# Patient Record
Sex: Male | Born: 1955 | Race: White | Hispanic: No | Marital: Married | Smoking: Never smoker
Health system: Southern US, Community
[De-identification: ages and names within clinical notes are randomized; demographics above are authoritative.]

## PROBLEM LIST (undated history)

## (undated) DIAGNOSIS — K900 Celiac disease: Secondary | ICD-10-CM

## (undated) DIAGNOSIS — K635 Polyp of colon: Secondary | ICD-10-CM

## (undated) DIAGNOSIS — H9193 Unspecified hearing loss, bilateral: Secondary | ICD-10-CM

## (undated) HISTORY — PX: EXTRAPLEURAL PNEUMONECTOMY AND RECONSTRUCTION DIAPHRAM: SUR597

## (undated) HISTORY — DX: Polyp of colon: K63.5

## (undated) HISTORY — DX: Celiac disease: K90.0

## (undated) HISTORY — PX: OTHER SURGICAL HISTORY: SHX169

## (undated) HISTORY — DX: Unspecified hearing loss, bilateral: H91.93

---

## 2008-07-28 ENCOUNTER — Encounter: Payer: Self-pay | Admitting: Family Medicine

## 2008-12-02 ENCOUNTER — Ambulatory Visit: Payer: Self-pay | Admitting: Family Medicine

## 2008-12-02 DIAGNOSIS — Z860100 Personal history of colon polyps, unspecified: Secondary | ICD-10-CM | POA: Insufficient documentation

## 2008-12-02 DIAGNOSIS — S270XXA Traumatic pneumothorax, initial encounter: Secondary | ICD-10-CM | POA: Insufficient documentation

## 2009-01-06 ENCOUNTER — Ambulatory Visit: Payer: Self-pay | Admitting: Family Medicine

## 2009-01-13 ENCOUNTER — Ambulatory Visit: Payer: Self-pay | Admitting: Family Medicine

## 2009-09-27 ENCOUNTER — Ambulatory Visit: Payer: Self-pay | Admitting: Family Medicine

## 2009-09-27 DIAGNOSIS — J069 Acute upper respiratory infection, unspecified: Secondary | ICD-10-CM | POA: Insufficient documentation

## 2010-01-19 ENCOUNTER — Ambulatory Visit: Payer: Self-pay | Admitting: Family Medicine

## 2010-03-07 IMAGING — CR DG CHEST 2V
2 series · 2 of 2 positions shown · non-contrast
Comparison: None

CLINICAL DATA: Follow-up pneumothorax.

CHEST - 2 VIEW

[view not recorded (1 of 2)]
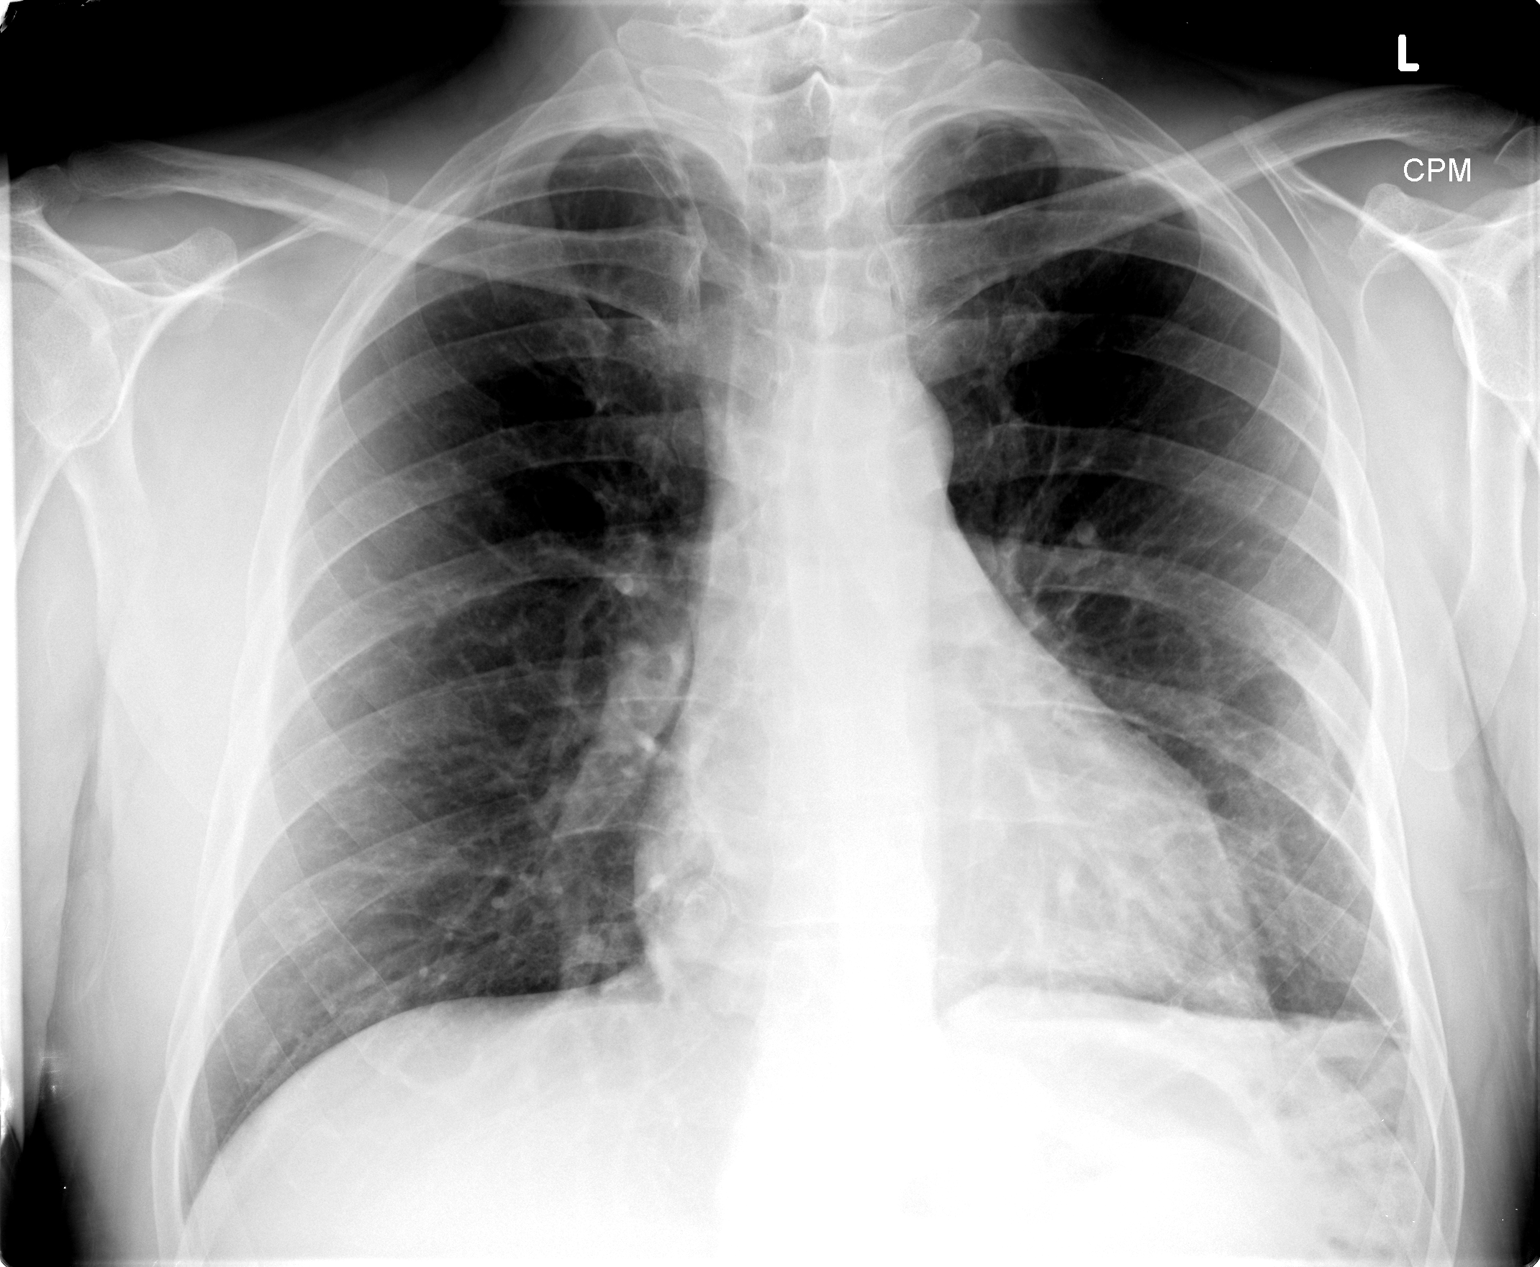

[view not recorded (2 of 2)]
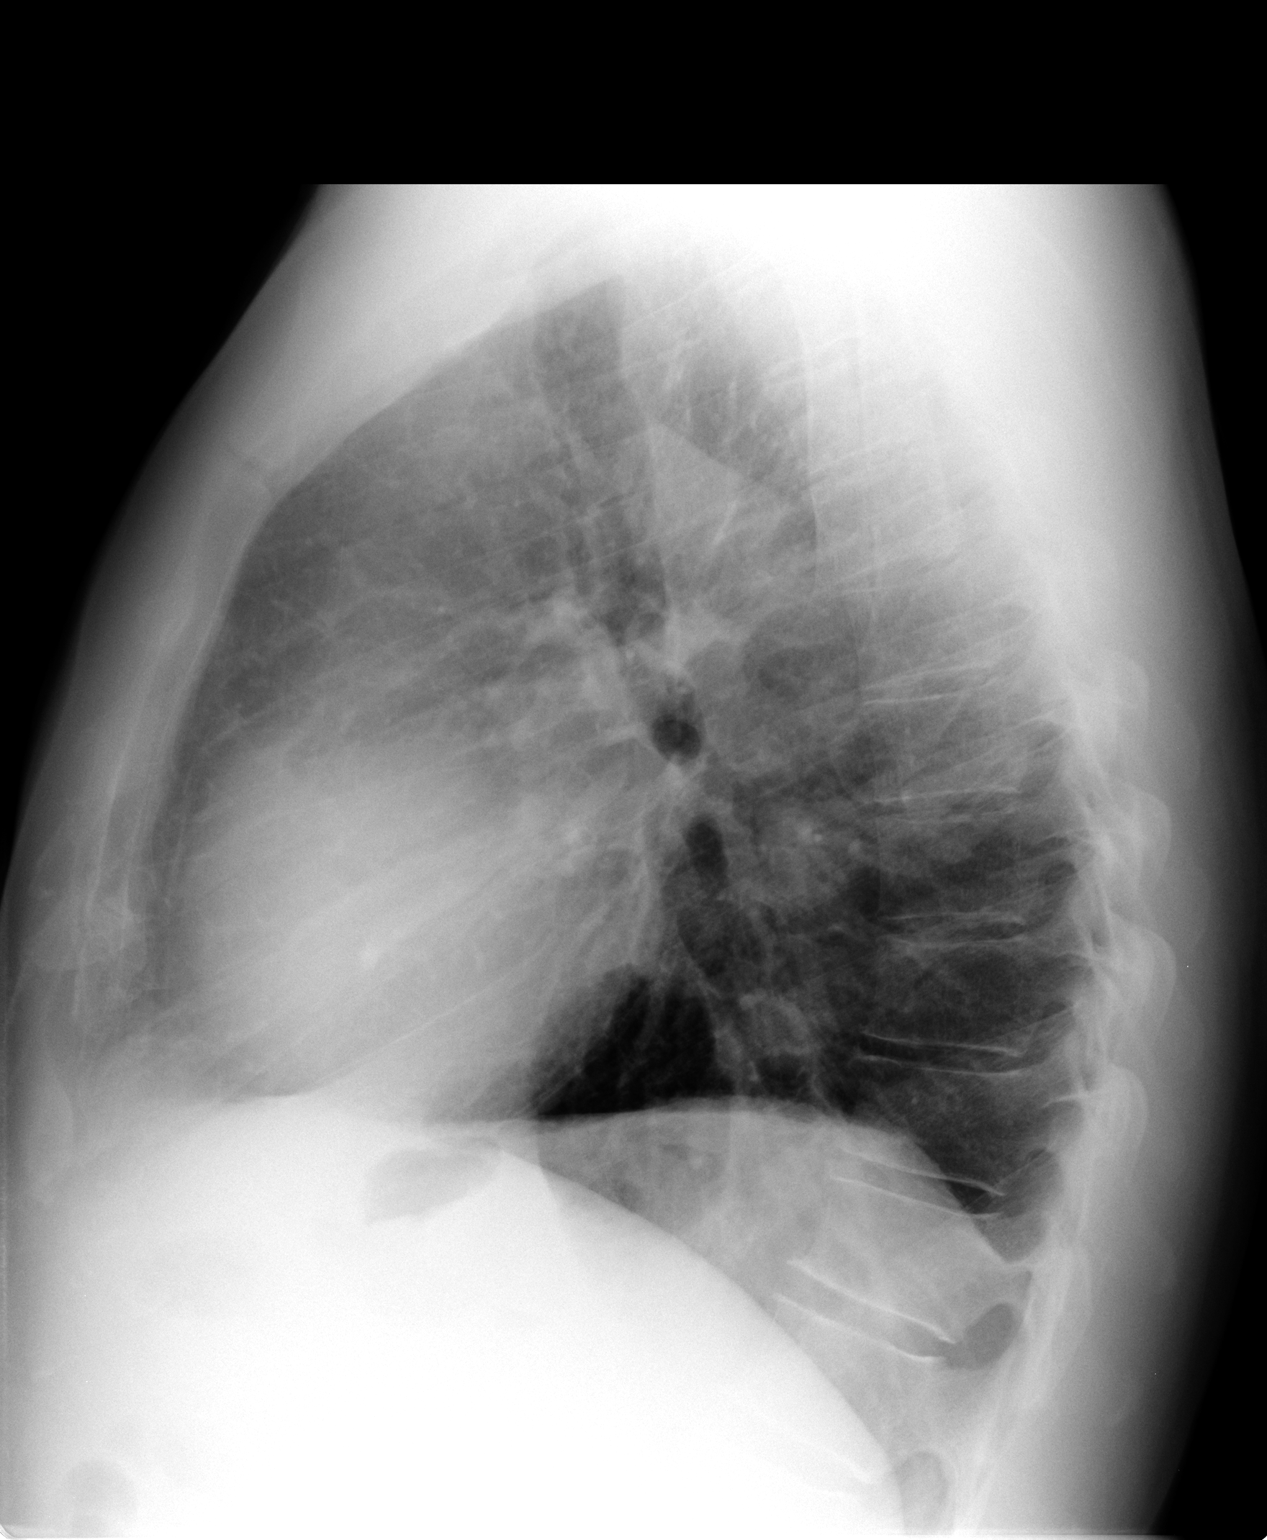

[2 of 2 positions shown; findings below may reference images not displayed]

FINDINGS: Heart size is normal.  The mediastinum is unremarkable.
The right lung is clear.  On the left, I do not see a pneumothorax.
There is mild scarring or atelectasis at the left base.  No pleural
effusion.  No significant bony finding.
IMPRESSION: Mild scarring or atelectasis left lower lobe.  No pneumothorax
seen.

## 2010-10-10 ENCOUNTER — Ambulatory Visit: Payer: Self-pay | Admitting: Family Medicine

## 2010-10-10 LAB — CONVERTED CEMR LAB
ALT: 24 units/L (ref 0–53)
AST: 20 units/L (ref 0–37)
Albumin: 4.2 g/dL (ref 3.5–5.2)
BUN: 18 mg/dL (ref 6–23)
Basophils Relative: 0.7 % (ref 0.0–3.0)
Bilirubin Urine: NEGATIVE
Chloride: 105 meq/L (ref 96–112)
Cholesterol: 175 mg/dL (ref 0–200)
Eosinophils Relative: 4.1 % (ref 0.0–5.0)
GFR calc non Af Amer: 87.64 mL/min (ref >60)
HCT: 42.9 % (ref 39.0–52.0)
Hemoglobin: 14.8 g/dL (ref 13.0–17.0)
Ketones, ur: NEGATIVE mg/dL
LDL Cholesterol: 117 mg/dL — ABNORMAL HIGH (ref 0–99)
Leukocytes, UA: NEGATIVE
Lymphs Abs: 2.8 10*3/uL (ref 0.7–4.0)
MCV: 95.5 fL (ref 78.0–100.0)
Monocytes Absolute: 0.7 10*3/uL (ref 0.1–1.0)
Monocytes Relative: 9.9 % (ref 3.0–12.0)
Neutro Abs: 3.2 10*3/uL (ref 1.4–7.7)
PSA: 0.73 ng/mL (ref 0.10–4.00)
Platelets: 275 10*3/uL (ref 150.0–400.0)
Potassium: 5 meq/L (ref 3.5–5.1)
Sodium: 139 meq/L (ref 135–145)
Specific Gravity, Urine: 1.02 (ref 1.000–1.030)
TSH: 0.52 microintl units/mL (ref 0.35–5.50)
Total Protein: 7.1 g/dL (ref 6.0–8.3)
Urine Glucose: NEGATIVE mg/dL
Urobilinogen, UA: 0.2 (ref 0.0–1.0)
WBC: 7 10*3/uL (ref 4.5–10.5)

## 2010-11-05 ENCOUNTER — Encounter: Payer: Self-pay | Admitting: Family Medicine

## 2010-11-05 ENCOUNTER — Ambulatory Visit: Payer: Self-pay | Admitting: Family Medicine

## 2010-11-13 ENCOUNTER — Ambulatory Visit: Payer: Self-pay | Admitting: Family Medicine

## 2010-11-13 DIAGNOSIS — H902 Conductive hearing loss, unspecified: Secondary | ICD-10-CM | POA: Insufficient documentation
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
## 2010-12-27 NOTE — Assessment & Plan Note (Signed)
Summary: ears clooged/njr  CC: ears clogged   CC:  ears clogged.  History of Present Illness: Dwayne Lyons is a 55 year old male, who comes in today to remove bilateral cerumen impactions.  After 45 minutes of flushing irrigation and suction.  The ear wax was removed from his left ear however, the ear wax was not able to be removed in the right ear canal.  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 1 or 2 tsps three times a day as needed 2)  Amoxicillin 875 Mg Tabs (Amoxicillin) .... One by mouth two times a day for 10 days.  Patient Instructions: 1)  used the earwax dissolving drops in ear, right ear canal.  Return in 3 weeks for reevaluation   Orders Added: 1)  Est. Patient Level III [16109]

## 2011-03-18 ENCOUNTER — Ambulatory Visit (INDEPENDENT_AMBULATORY_CARE_PROVIDER_SITE_OTHER): Payer: BC Managed Care – PPO | Admitting: Family Medicine

## 2011-03-18 ENCOUNTER — Encounter: Payer: Self-pay | Admitting: Family Medicine

## 2011-03-18 VITALS — BP 110/80 | Temp 98.1°F | Ht 74.0 in | Wt 242.0 lb

## 2011-03-18 DIAGNOSIS — H9190 Unspecified hearing loss, unspecified ear: Secondary | ICD-10-CM

## 2011-03-18 DIAGNOSIS — J45909 Unspecified asthma, uncomplicated: Secondary | ICD-10-CM

## 2011-03-18 DIAGNOSIS — H9191 Unspecified hearing loss, right ear: Secondary | ICD-10-CM

## 2011-03-18 MED ORDER — PREDNISONE 20 MG PO TABS: ORAL_TABLET | ORAL | Status: DC

## 2011-03-18 NOTE — Patient Instructions (Signed)
Take the prednisone as directed.  Return p.r.n. 

## 2011-03-18 NOTE — Progress Notes (Signed)
  Subjective:    Patient ID: Dwayne Lyons, male    DOB: 07/07/1956, 55 y.o.   MRN: [id number]  Dwayne Lyons is a 55 year old, married man nonsmoker, who comes in with a 4-day history of head congestion, postnasal drip, and cough.  No fever.  He has had a history of allergic rhinitis in the past.  Last week.  He was at [employer], and there was a lot of dust.  Also, he can hear out of his right ear.  In the past has had problems with ear wax    Review of Systems General infectious and immunologic review of systems otherwise negative    Objective:   Physical Exam Well developed, well nourished man in no acute distress.  HEENT were negative, except for cerumen impaction right ear, which was removed by irrigation.  Neck was supple.  No adenopathy.  Lungs are clear except for some mild late expiratory wheezing       Assessment & Plan:  Asthma,,,,,,,,,,,, prednisone burst and taper.  Hearing loss right ear secondary to cerumen impaction removed by irrigation

## 2011-03-20 ENCOUNTER — Telehealth: Payer: Self-pay | Admitting: Family Medicine

## 2011-03-20 MED ORDER — HYDROCODONE-HOMATROPINE 5-1.5 MG/5ML PO SYRP: 4.0000 mL | ORAL_SOLUTION | Freq: Four times a day (QID) | ORAL | Status: DC | PRN

## 2011-03-20 NOTE — Telephone Encounter (Signed)
Pt called and said that he is still coughing, especially at night. Pt is req that Dr Tawanna Cooler call in a cough syrup. Cough keeping pt up at night. [facility] [address]

## 2011-03-20 NOTE — Telephone Encounter (Signed)
rx called in

## 2011-03-20 NOTE — Telephone Encounter (Signed)
Hydromet 4 ounces directions one half to 1 teaspoon at bedtime p.r.n. Cough refill x 1

## 2011-03-21 ENCOUNTER — Telehealth: Payer: Self-pay | Admitting: Family Medicine

## 2011-03-21 NOTE — Telephone Encounter (Signed)
We do not call in antibiotics over the phone if he feels like he needs to be seen will be happy to see him this afternoon

## 2011-03-21 NOTE — Telephone Encounter (Signed)
Tried to call but unable to leave message for patient

## 2011-03-21 NOTE — Telephone Encounter (Signed)
Pt called and said that the cough syrup that was prescribed, still kept pt up all night. Pt says that the congestion is now yellowish green in color. Pt says throat is sore and still has cough. Pt wondering if he can get antibiotic [facility] [address].

## 2011-09-10 ENCOUNTER — Encounter: Payer: Self-pay | Admitting: Family Medicine

## 2011-09-10 ENCOUNTER — Ambulatory Visit (INDEPENDENT_AMBULATORY_CARE_PROVIDER_SITE_OTHER): Payer: BC Managed Care – PPO | Admitting: Family Medicine

## 2011-09-10 VITALS — Temp 98.6°F | Wt 246.0 lb

## 2011-09-10 DIAGNOSIS — Z23 Encounter for immunization: Secondary | ICD-10-CM

## 2011-09-10 DIAGNOSIS — J069 Acute upper respiratory infection, unspecified: Secondary | ICD-10-CM

## 2011-09-10 MED ORDER — HYDROCODONE-HOMATROPINE 5-1.5 MG/5ML PO SYRP: ORAL_SOLUTION | ORAL | Status: DC

## 2011-09-10 NOTE — Patient Instructions (Signed)
Take Tylenol, Motrin or aspirin for general cold symptoms.  Drink lots of water.  Hydromet..... One half to 1 teaspoon at bedtime p.r.n. For cough.  Return p.r.n.

## 2011-09-10 NOTE — Progress Notes (Signed)
  Subjective:    Patient ID: Dwayne Lyons, male    DOB: December 04, 1955, 55 y.o.   MRN: [id number]  HPI Dwayne Lyons is a 55 year old, married male, nonsmoker, who comes in today with a cold for 5 days.  He has had congestion, runny nose, and cough.  No sputum production.  No fever, no shortness of breath, etc., etc.   Review of Systems    General and pulmonary use systems otherwise negative Objective:   Physical Exam  Well-developed well-nourished, male in no acute distress.  HEENT negative.  Neck was supple.  No adenopathy.  Lungs clear      Assessment & Plan:  Viral syndrome.  Plan treat symptomatically, lots of liquids, Tylenol, Hydromet p.r.n. cough

## 2011-11-27 ENCOUNTER — Telehealth: Payer: Self-pay

## 2011-11-27 NOTE — Telephone Encounter (Signed)
Pt is traveling and is experiencing extreme discomfort.  Pt is thinking it is from a hital hernia.  Pt is requesting appt on 1/10 with Dr. Tawanna Cooler.   Called and offered pt 12/05/11 at 11:00 pt declined.  Pt will call back to schedule appt.

## 2011-12-02 ENCOUNTER — Ambulatory Visit (INDEPENDENT_AMBULATORY_CARE_PROVIDER_SITE_OTHER): Payer: BC Managed Care – PPO | Admitting: Family Medicine

## 2011-12-02 ENCOUNTER — Encounter: Payer: Self-pay | Admitting: Family Medicine

## 2011-12-02 DIAGNOSIS — R1011 Right upper quadrant pain: Secondary | ICD-10-CM

## 2011-12-02 NOTE — Patient Instructions (Signed)
Return if the symptoms recur for reevaluation
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
## 2011-12-05 ENCOUNTER — Ambulatory Visit: Payer: BC Managed Care – PPO | Admitting: Family Medicine

## 2012-12-10 ENCOUNTER — Ambulatory Visit (INDEPENDENT_AMBULATORY_CARE_PROVIDER_SITE_OTHER): Payer: BC Managed Care – PPO | Admitting: Family Medicine

## 2012-12-10 ENCOUNTER — Encounter: Payer: Self-pay | Admitting: Family Medicine

## 2012-12-10 ENCOUNTER — Telehealth: Payer: Self-pay | Admitting: Family Medicine

## 2012-12-10 VITALS — BP 120/80 | Temp 97.8°F | Wt 237.0 lb

## 2012-12-10 DIAGNOSIS — J069 Acute upper respiratory infection, unspecified: Secondary | ICD-10-CM

## 2012-12-10 MED ORDER — HYDROCODONE-HOMATROPINE 5-1.5 MG/5ML PO SYRP: 5.0000 mL | ORAL_SOLUTION | Freq: Three times a day (TID) | ORAL | Status: DC | PRN
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
## 2012-12-10 NOTE — Telephone Encounter (Signed)
appt set thanks.

## 2012-12-10 NOTE — Telephone Encounter (Signed)
Pt has severe cough, hard to talk, no fever, chest congestion, some runny nose, thick phlem, if he does anything , pt gets very exhausted. Pls advise.

## 2012-12-10 NOTE — Telephone Encounter (Signed)
Please schedule at 12:45

## 2012-12-10 NOTE — Patient Instructions (Signed)
Drink lots of water  Hydromet 1/2-1 teaspoon 3 times daily. For cough and cold  Return.

## 2013-03-02 ENCOUNTER — Telehealth: Payer: Self-pay | Admitting: Family Medicine

## 2013-03-02 DIAGNOSIS — J069 Acute upper respiratory infection, unspecified: Secondary | ICD-10-CM

## 2013-03-02 MED ORDER — HYDROCODONE-HOMATROPINE 5-1.5 MG/5ML PO SYRP: 5.0000 mL | ORAL_SOLUTION | Freq: Three times a day (TID) | ORAL | Status: DC | PRN

## 2013-03-02 NOTE — Telephone Encounter (Signed)
Per Dr Tawanna Cooler - Hydromet

## 2013-03-02 NOTE — Telephone Encounter (Signed)
Patient came in today thinking he had an appointment that was supposedly made on yesterday for today at 12:30.  He was coming in for cough and congestion.  He says that he had his spline taken out years ago and at some point around this time of year this always happens to him.  He said it started off as congestion, then went to a barking cough at night, then last night, it went to his head, he has sinus pressure, cough and headache. [facility] [address]. Pt's cell is [phone]. Please advise. Thank you

## 2014-04-20 ENCOUNTER — Telehealth: Payer: Self-pay | Admitting: Family Medicine

## 2014-04-20 NOTE — Telephone Encounter (Signed)
error 

## 2014-04-25 ENCOUNTER — Telehealth: Payer: Self-pay | Admitting: Family Medicine

## 2014-04-25 ENCOUNTER — Ambulatory Visit: Payer: BC Managed Care – PPO | Admitting: Family Medicine

## 2014-04-25 NOTE — Telephone Encounter (Signed)
noted 

## 2014-04-25 NOTE — Telephone Encounter (Signed)
Pt cancelled today's appointment due to work schedule.  Will callback to r/s.

## 2014-05-31 ENCOUNTER — Encounter: Payer: Self-pay | Admitting: Family Medicine

## 2014-05-31 ENCOUNTER — Ambulatory Visit (INDEPENDENT_AMBULATORY_CARE_PROVIDER_SITE_OTHER): Payer: BC Managed Care – PPO | Admitting: Family Medicine

## 2014-05-31 VITALS — BP 118/76 | HR 70 | Temp 98.6°F | Wt 244.0 lb

## 2014-05-31 DIAGNOSIS — R05 Cough: Secondary | ICD-10-CM

## 2014-05-31 DIAGNOSIS — R059 Cough, unspecified: Secondary | ICD-10-CM

## 2014-05-31 DIAGNOSIS — J3089 Other allergic rhinitis: Secondary | ICD-10-CM

## 2014-05-31 DIAGNOSIS — J302 Other seasonal allergic rhinitis: Secondary | ICD-10-CM

## 2014-05-31 MED ORDER — PREDNISONE 20 MG PO TABS: ORAL_TABLET | ORAL | Status: DC

## 2014-05-31 NOTE — Progress Notes (Signed)
Pre visit review using our clinic review tool, if applicable. No additional management support is needed unless otherwise documented below in the visit note. 

## 2014-05-31 NOTE — Patient Instructions (Addendum)
Allergic Rhinitis Allergic rhinitis is when the mucous membranes in the nose respond to allergens. Allergens are particles in the air that cause your body to have an allergic reaction. This causes you to release allergic antibodies. Through a chain of events, these eventually cause you to release histamine into the blood stream. Although meant to protect the body, it is this release of histamine that causes your discomfort, such as frequent sneezing, congestion, and an itchy, runny nose.  CAUSES  Seasonal allergic rhinitis (hay fever) is caused by pollen allergens that may come from grasses, trees, and weeds. Year-round allergic rhinitis (perennial allergic rhinitis) is caused by allergens such as house dust mites, pet dander, and mold spores.  SYMPTOMS   Nasal stuffiness (congestion).  Itchy, runny nose with sneezing and tearing of the eyes. DIAGNOSIS  Your health care provider can help you determine the allergen or allergens that trigger your symptoms. If you and your health care provider are unable to determine the allergen, skin or blood testing may be used. TREATMENT  Allergic rhinitis does not have a cure, but it can be controlled by:  Medicines and allergy shots (immunotherapy).  Avoiding the allergen. Hay fever may often be treated with antihistamines in pill or nasal spray forms. Antihistamines block the effects of histamine. There are over-the-counter medicines that may help with nasal congestion and swelling around the eyes. Check with your health care provider before taking or giving this medicine.  If avoiding the allergen or the medicine prescribed do not work, there are many new medicines your health care provider can prescribe. Stronger medicine may be used if initial measures are ineffective. Desensitizing injections can be used if medicine and avoidance does not work. Desensitization is when a patient is given ongoing shots until the body becomes less sensitive to the allergen.  Make sure you follow up with your health care provider if problems continue. HOME CARE INSTRUCTIONS It is not possible to completely avoid allergens, but you can reduce your symptoms by taking steps to limit your exposure to them. It helps to know exactly what you are allergic to so that you can avoid your specific triggers. SEEK MEDICAL CARE IF:   You have a fever.  You develop a cough that does not stop easily (persistent).  You have shortness of breath.  You start wheezing.  Symptoms interfere with normal daily activities. Document Released: 08/06/2001 Document Revised: 11/16/2013 Document Reviewed: 07/19/2013 [facility] Patient Information 2015 [employer], [place]. This information is not intended to replace advice given to you by your health care provider. Make sure you discuss any questions you have with your health care provider.  Consider Flonase or Nasacort (in addition to Claritan or Zyrtec) for allergy relief.
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
## 2014-07-26 ENCOUNTER — Ambulatory Visit (INDEPENDENT_AMBULATORY_CARE_PROVIDER_SITE_OTHER): Payer: BC Managed Care – PPO | Admitting: Family Medicine

## 2014-07-26 ENCOUNTER — Encounter: Payer: Self-pay | Admitting: Family Medicine

## 2014-07-26 VITALS — BP 110/80 | Temp 98.6°F | Wt 240.0 lb

## 2014-07-26 DIAGNOSIS — J069 Acute upper respiratory infection, unspecified: Secondary | ICD-10-CM

## 2014-07-26 MED ORDER — HYDROCODONE-HOMATROPINE 5-1.5 MG/5ML PO SYRP: 5.0000 mL | ORAL_SOLUTION | Freq: Three times a day (TID) | ORAL | Status: DC | PRN

## 2014-07-26 NOTE — Patient Instructions (Signed)
Drink lots of water  Hydromet...........Marland Kitchen 1/2-1 teaspoon 3 times daily. For cough  Ear wax kit///////// 2 drops left ear bedtime....... flush in 2 weeks with warm water

## 2014-07-26 NOTE — Progress Notes (Signed)
   Subjective:    Patient ID: Dwayne Lyons, male    DOB: 07-05-1956, 58 y.o.   MRN: [id number]  HPI Antonin is a 58 year old married male nonsmoker who comes in with a five-day history of a cough  He developed some head congestion cough about 5 days ago. No fever chills no shortness of breath no wheezing no sputum production.   Review of Systems    pulmonary review of systems otherwise negative Objective:   Physical Exam  Well-developed well-nourished male in no acute distress vital signs stable he is afebrile HEENT negative neck except for cerumen impaction left ear  Cardiac exam normal pulmonary exam normal      Assessment & Plan:  Viral syndrome........ treat symptomatically with cough syrup

## 2014-07-26 NOTE — Progress Notes (Signed)
Pre visit review using our clinic review tool, if applicable. No additional management support is needed unless otherwise documented below in the visit note. 

## 2014-08-02 ENCOUNTER — Ambulatory Visit (INDEPENDENT_AMBULATORY_CARE_PROVIDER_SITE_OTHER): Payer: BC Managed Care – PPO | Admitting: Family Medicine

## 2014-08-02 ENCOUNTER — Encounter: Payer: Self-pay | Admitting: Family Medicine

## 2014-08-02 VITALS — BP 110/80 | Temp 98.1°F | Ht 74.0 in | Wt 235.0 lb

## 2014-08-02 DIAGNOSIS — H6123 Impacted cerumen, bilateral: Secondary | ICD-10-CM

## 2014-08-02 DIAGNOSIS — Z86010 Personal history of colonic polyps: Secondary | ICD-10-CM

## 2014-08-02 DIAGNOSIS — Z23 Encounter for immunization: Secondary | ICD-10-CM

## 2014-08-02 DIAGNOSIS — J069 Acute upper respiratory infection, unspecified: Secondary | ICD-10-CM

## 2014-08-02 DIAGNOSIS — Z800 Family history of malignant neoplasm of digestive organs: Secondary | ICD-10-CM | POA: Insufficient documentation

## 2014-08-02 DIAGNOSIS — H6120 Impacted cerumen, unspecified ear: Secondary | ICD-10-CM

## 2014-08-02 DIAGNOSIS — H902 Conductive hearing loss, unspecified: Secondary | ICD-10-CM

## 2014-08-02 DIAGNOSIS — Z0000 Encounter for general adult medical examination without abnormal findings: Secondary | ICD-10-CM

## 2014-08-02 LAB — CBC WITH DIFFERENTIAL/PLATELET
BASOS ABS: 0.1 10*3/uL (ref 0.0–0.1)
Basophils Relative: 1.3 % (ref 0.0–3.0)
EOS ABS: 0.3 10*3/uL (ref 0.0–0.7)
Eosinophils Relative: 5.5 % — ABNORMAL HIGH (ref 0.0–5.0)
HCT: 44.7 % (ref 39.0–52.0)
Hemoglobin: 15.1 g/dL (ref 13.0–17.0)
Lymphocytes Relative: 25.6 % (ref 12.0–46.0)
Lymphs Abs: 1.5 10*3/uL (ref 0.7–4.0)
MCHC: 33.8 g/dL (ref 30.0–36.0)
MCV: 94.1 fl (ref 78.0–100.0)
MONOS PCT: 10.5 % (ref 3.0–12.0)
Monocytes Absolute: 0.6 10*3/uL (ref 0.1–1.0)
Neutro Abs: 3.3 10*3/uL (ref 1.4–7.7)
Neutrophils Relative %: 57.1 % (ref 43.0–77.0)
Platelets: 303 10*3/uL (ref 150.0–400.0)
RBC: 4.75 Mil/uL (ref 4.22–5.81)
RDW: 14.3 % (ref 11.5–15.5)
WBC: 5.7 10*3/uL (ref 4.0–10.5)

## 2014-08-02 LAB — BASIC METABOLIC PANEL
BUN: 7 mg/dL (ref 6–23)
CO2: 28 mEq/L (ref 19–32)
Calcium: 9.1 mg/dL (ref 8.4–10.5)
Chloride: 106 mEq/L (ref 96–112)
Creatinine, Ser: 0.8 mg/dL (ref 0.4–1.5)
GFR: 101.03 mL/min (ref >60.00)
Glucose, Bld: 85 mg/dL (ref 70–99)
POTASSIUM: 4.2 meq/L (ref 3.5–5.1)
SODIUM: 140 meq/L (ref 135–145)

## 2014-08-02 LAB — LIPID PANEL
CHOL/HDL RATIO: 6
Cholesterol: 160 mg/dL (ref 0–200)
HDL: 28.9 mg/dL — AB (ref >39.00)
LDL CALC: 110 mg/dL — AB (ref 0–99)
NonHDL: 131.1
TRIGLYCERIDES: 108 mg/dL (ref 0.0–149.0)
VLDL: 21.6 mg/dL (ref 0.0–40.0)

## 2014-08-02 LAB — HEPATIC FUNCTION PANEL
ALBUMIN: 4.2 g/dL (ref 3.5–5.2)
ALK PHOS: 55 U/L (ref 39–117)
ALT: 22 U/L (ref 0–53)
AST: 20 U/L (ref 0–37)
Bilirubin, Direct: 0.1 mg/dL (ref 0.0–0.3)
TOTAL PROTEIN: 7.6 g/dL (ref 6.0–8.3)
Total Bilirubin: 1.5 mg/dL — ABNORMAL HIGH (ref 0.2–1.2)

## 2014-08-02 LAB — POCT URINALYSIS DIPSTICK
Bilirubin, UA: NEGATIVE
GLUCOSE UA: NEGATIVE
Ketones, UA: NEGATIVE
LEUKOCYTES UA: NEGATIVE
Nitrite, UA: NEGATIVE
Protein, UA: NEGATIVE
RBC UA: NEGATIVE
Spec Grav, UA: 1.01
UROBILINOGEN UA: 0.2
pH, UA: 5.5

## 2014-08-02 LAB — PSA: PSA: 0.74 ng/mL (ref 0.10–4.00)

## 2014-08-02 LAB — TSH: TSH: 0.66 u[IU]/mL (ref 0.35–4.50)

## 2014-08-02 MED ORDER — PREDNISONE 20 MG PO TABS: ORAL_TABLET | ORAL | Status: AC

## 2014-08-02 MED ORDER — DOXYCYCLINE HYCLATE 100 MG PO TABS: 100.0000 mg | ORAL_TABLET | Freq: Two times a day (BID) | ORAL | Status: AC

## 2014-08-02 MED ORDER — HYDROCODONE-HOMATROPINE 5-1.5 MG/5ML PO SYRP: 5.0000 mL | ORAL_SOLUTION | Freq: Three times a day (TID) | ORAL | Status: AC | PRN

## 2014-08-02 NOTE — Progress Notes (Signed)
Pre visit review using our clinic review tool, if applicable. No additional management support is needed unless otherwise documented below in the visit note. 
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
## 2014-08-02 NOTE — Patient Instructions (Signed)
Doxycycline........ one twice daily  Prednisone 20 mg.......... 2 tabs x3 days taper as outlined  Return in one year sooner if any problems

## 2014-08-03 ENCOUNTER — Telehealth: Payer: Self-pay | Admitting: Family Medicine

## 2014-08-03 NOTE — Telephone Encounter (Signed)
Pt returning your call.  pls call before 9am

## 2014-08-04 NOTE — Telephone Encounter (Signed)
Spoke with patient.

## 2014-09-06 ENCOUNTER — Other Ambulatory Visit: Payer: Self-pay | Admitting: *Deleted

## 2014-09-06 NOTE — Telephone Encounter (Signed)
Patient has fininshed his prednisone

## 2014-09-06 NOTE — Telephone Encounter (Signed)
Per Dr Tawanna Coolerodd patient should try another round of prednisone.  If no improvement then he should go to pulmonology. Patient is aware.  He will call back if refill needed.  Copy of labs sent to home address per patient's request.

## 2014-09-06 NOTE — Telephone Encounter (Signed)
Patient has finished his prednisone and prescriptions.  He is some better but still has wheezing and a cough.  Please advise.
